# Patient Record
Sex: Male | Born: 1985 | Race: White | Hispanic: No | Marital: Single | Smoking: Never smoker
Health system: Southern US, Community
[De-identification: ages and names within clinical notes are randomized; demographics above are authoritative.]

## PROBLEM LIST (undated history)

## (undated) DIAGNOSIS — F32A Depression, unspecified: Secondary | ICD-10-CM

## (undated) DIAGNOSIS — F419 Anxiety disorder, unspecified: Secondary | ICD-10-CM

---
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
## 2004-08-14 ENCOUNTER — Encounter (INDEPENDENT_AMBULATORY_CARE_PROVIDER_SITE_OTHER): Payer: Self-pay | Admitting: *Deleted

## 2004-08-14 HISTORY — PX: LAPAROSCOPIC APPENDECTOMY: SUR753

## 2004-08-15 ENCOUNTER — Inpatient Hospital Stay (HOSPITAL_COMMUNITY): Admission: EM | Admit: 2004-08-15 | Discharge: 2004-08-17 | Payer: Self-pay | Admitting: Emergency Medicine

## 2004-08-15 ENCOUNTER — Ambulatory Visit: Payer: Self-pay | Admitting: Internal Medicine

## 2007-10-20 ENCOUNTER — Emergency Department (HOSPITAL_COMMUNITY): Admission: EM | Admit: 2007-10-20 | Discharge: 2007-10-20 | Payer: Self-pay | Admitting: Family Medicine

## 2008-05-31 ENCOUNTER — Emergency Department (HOSPITAL_COMMUNITY): Admission: EM | Admit: 2008-05-31 | Discharge: 2008-05-31 | Payer: Self-pay | Admitting: Emergency Medicine
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
## 2010-06-23 LAB — POCT I-STAT, CHEM 8
BUN: 11 mg/dL (ref 6–23)
Chloride: 104 mEq/L (ref 96–112)
HCT: 45 % (ref 39.0–52.0)
Hemoglobin: 15.3 g/dL (ref 13.0–17.0)
Sodium: 141 mEq/L (ref 135–145)

## 2010-06-23 LAB — CBC
HCT: 43.9 % (ref 39.0–52.0)
Hemoglobin: 15 g/dL (ref 13.0–17.0)
MCHC: 34.2 g/dL (ref 30.0–36.0)
MCV: 87.9 fL (ref 78.0–100.0)
Platelets: 160 10*3/uL (ref 150–400)
RBC: 5 MIL/uL (ref 4.22–5.81)
RDW: 12.8 % (ref 11.5–15.5)
WBC: 6.8 10*3/uL (ref 4.0–10.5)

## 2010-06-23 LAB — PROTIME-INR
INR: 1 (ref 0.00–1.49)
Prothrombin Time: 13.2 seconds (ref 11.6–15.2)

## 2010-06-23 LAB — ETHANOL: Alcohol, Ethyl (B): 57 mg/dL — ABNORMAL HIGH (ref 0–10)

## 2010-07-29 NOTE — Op Note (Signed)
NAME:  Keith Rubio, Keith Rubio           ACCOUNT NO.:  [id number]   MEDICAL RECORD NO.:  [id number]          PATIENT TYPE:  EMS   LOCATION:  ED                           FACILITY:  [facility]   PHYSICIAN:  Angelia Mould. Derrell Lolling, M.D.DATE OF BIRTH:  01/05/1986   DATE OF PROCEDURE:  08/14/2004  DATE OF DISCHARGE:                                 OPERATIVE REPORT   PREOPERATIVE DIAGNOSIS:  Acute appendicitis.   POSTOPERATIVE DIAGNOSES:  Acute appendicitis.   OPERATION PERFORMED AT:  Laparoscopic appendectomy.   SURGEON:  Angelia Mould. Derrell Lolling, M.D.   OPERATIVE INDICATIONS:  This is a 25 year old white male who presents with  about a 28-hour history of right lower quadrant pain, this had been steady,  somewhat progressive, moderately severe associated with nausea and vomiting.  No prior similar episodes. On exam, he is found to have a very localized but  significant tenderness and guarding in the right lower quadrant. Lab work  revealed a white blood cell count 22,500. He had no other symptoms, it was  felt most likely he had appendicitis. He was brought to operating room for  diagnostic laparoscopy and laparoscopic appendectomy.   FINDINGS:  The appendix was acutely inflamed and there was some exudate on  it. There was no evidence of rupture or gangrene. The terminal ileum, cecum,  liver, gallbladder and rectosigmoid colon looked fine. No evidence of any  other inflammatory process.   OPERATIVE TECHNIQUE:  Following the induction of general endotracheal  anesthesia, the patient's head was prepped and draped in sterile fashion.  The patient was identified. Intravenous antibiotics were given. 0.5%  Marcaine with epinephrine was used as a local infiltration anesthetic.   A 12 mm OptiView port was placed in the left upper quadrant. This insertion  was atraumatic and uneventful. Pneumoperitoneum was created. The video was  inserted. Visualization was carried out with findings as described above.  There  was no evidence of any bleeding or injury from the to the trocar  insertion. A 5 mm trocar was placed in the left rectus sheath below the  umbilicus and a 12 mm trocars placed in the suprapubic area on the left. We  rolled the cecum and terminal ileum and appendix medially. We had to bluntly  dissect the appendix away from the lateral abdominal wall and from the right  colon because of inflammatory adhesions. We were ultimately able to mobilize  the appendix up a bit and using the harmonic scalpel, divided the  appendiceal artery and the appendiceal mesentery. We did this stepwise until  we had the appendix completely skeletonized away from its mesentery and  could see clearly the insertion of the appendix onto the cecum. The base of  the appendix was transected and secured with an EndoGIA stapling device.  This was placed perpendicularly across the appendix at the base of the  cecum, closed, held in place for about 45 seconds then fired and removed.  The appendix was placed in a specimen bag and removed. The operative field  was inspected and irrigated with saline. We also irrigated out the  subphrenic space of the pelvis a little bit  because there was a little bit  of cloudy fluid. The staple line looked perfectly hemostatic and very  secure. A few staples were free in the abdominal cavity and these were all  retrieved and removed. The trocars were removed under direct vision and  there is no bleeding from the trocar sites. The pneumoperitoneum was  released. The fascia at the trocar sites were closed with interrupted  sutures of #0 Vicryl and this closed the fascia  quite nicely. The wounds were was irrigated with saline and skin closed with  subcuticular sutures of 4-0 Monocryl and Steri-Strips. Clean bandages were  placed and the patient taken to the recovery room in stable condition.  Estimated blood loss about 10 mL. Complications none. Sponge, needle and  instrument counts were  correct.       HMI/MEDQ  D:  08/15/2004  T:  08/15/2004  Job:  [id number]   cc:   [facility], [address]

## 2010-07-29 NOTE — Consult Note (Signed)
Keith Rubio, BOECKMAN           ACCOUNT NO.:  [id number]   MEDICAL RECORD NO.:  [id number]          PATIENT TYPE:  INP   LOCATION:  1321                         FACILITY:  [facility]   PHYSICIAN:  Bernette Redbird, M.D.   DATE OF BIRTH:  03/01/1986   DATE OF CONSULTATION:  08/16/2004  DATE OF DISCHARGE:                                   CONSULTATION   Dr. Derrell Lolling asked me to see this 25 year old because of transient  hematemesis.   Thayer Ohm presented with acute appendicitis for which he underwent a  laparoscopically-assisted appendectomy about 36 hours ago. In the PACU  following surgery, apparently while being extubated, he vomited bright red  blood and was transiently hypoxemic. The patient does have a history of a  very strong gag  reflex. An NG was placed and there was transient heme-  positive drainage which cleared. Since then he has not had any further  bleeding and the NG tube has been discontinued.  Today, he feels well and is  tolerating a regular diet. There has been no drop in hemoglobin which is  stable at 14.1.   At baseline, the patient does have occasional heartburn perhaps once a week  which he does not treat. No problem with dysphagia, loss of weight or  trouble swallowing.   PAST MEDICAL HISTORY:  No known allergies.   OUTPATIENT MEDICATIONS:  None.   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   MEDICAL ILLNESSES:  No chronic conditions.   FAMILY HISTORY:  Not obtained.   SOCIAL HISTORY:  A graduate of [employer].  Now he is preparing  to go the community college in the future and currently works at a [employer].   REVIEW OF SYSTEMS:  Negative for loss of weight or bowel problems.   PHYSICAL EXAM:  Healthy-appearing gentleman in no evident distress  whatsoever appearing neither anxious nor depressed. He is anicteric and  without evident pallor. The  chest is clear. Heart is normal. There is no  supraclavicular adenopathy. There is some abdominal tenderness consistent  with recent surgery but this is probably incisional in origin; no exquisite  tenderness.   LABS:  Admission hemoglobin was 16.1 and today is 14.4. Some of this  probably pertains to hydrational drop. His hemoglobin has been stable over  the past four hours, although it does reflect about a 2 g drop from baseline  on admission. His BUN has been consistently normal since admission including  the level of 7 today. Liver chemistries were normal on admission except for  total bilirubin of 1.4.   IMPRESSION:  Transient upper gastrointestinal bleed which, based on the  patient's history, I think most likely is related to a Mallory-Weiss tear,  although it is conceivable that erosive esophagitis could present in this  fashion.   PLAN:  This the patient has occasional heartburn and a solitary episode of  hematemesis associated with manipulation of the oropharynx in the setting of  a strong gag reflex. I suspect he probably has a  Mallory-Weiss tear and not  any frank esophagitis.   RECOMMENDATIONS:  1.  The patient and his father prefer expectant management rather than      diagnostic evaluation such as an upper endoscopy or an upper GI series,      which were described to them.  2.  From the GI tract standpoint, I think it is fine for the patient to go      home at any time.  3.  No specific follow-up with me is required but I gave the patient my card      in the event that symptoms develop in the future.  4.  There is no clear need for ongoing PPI therapy in this patient but I      will give him a dose of Protonix today and tomorrow morning prior to      discharge to facilitate any healing of gastritis, esophagitis or Mallory-      Weiss mucosal defects that may be present.      RB/MEDQ  D:  08/16/2004  T:  08/16/2004  Job:  [id number]

## 2010-07-29 NOTE — H&P (Signed)
NAME:  Keith Rubio, Keith Rubio           ACCOUNT NO.:  [id number]   MEDICAL RECORD NO.:  [id number]          PATIENT TYPE:  EMS   LOCATION:  ED                           FACILITY:  [facility]   PHYSICIAN:  Angelia Mould. Derrell Lolling, M.D.DATE OF BIRTH:  02-Aug-1985   DATE OF ADMISSION:  08/14/2004  DATE OF DISCHARGE:                                HISTORY & PHYSICAL   CHIEF COMPLAINT:  Right lower quadrant abdominal pain and vomiting.   HISTORY OF PRESENT ILLNESS:  This is a 25 year old white male in excellent  health.  He noted the onset of centralized abdominal pain at approximately 3  p.m. yesterday.  A couple of hours later, it was much more severe and  localized to the right lower quadrant.  He vomited 3 times last night and  once today.  He has not had any diarrhea.  He has not had any prior  episodes.  His highest temperature noted at home was 99.5.  No trouble  urinating.  No prior GI problems.  He went to [facility] this  afternoon, where he was found to have right lower quadrant tenderness, and a  white blood cell count was elevated at over 20,000.  I was called, and the  patient was sent to [facility] emergency room for my evaluation.   PAST HISTORY:  He does not have any medical problems.   PAST SURGICAL HISTORY:  1.  He had a foot operation as an infant.  2.  He had an operation for a right undescended testicle as an infant.   CURRENT MEDICATIONS:  None.   DRUG ALLERGIES:  None.   SOCIAL HISTORY:  The patient is single.  Lives in [place] with his  parents.  He is in school at [employer] this summer, and in the fall will probably  go back to either [employer] or [employer] in business administration.  He  also works at [employer].   FAMILY HISTORY:  Mother and father living and well.  The patient is an only  child.   REVIEW OF SYSTEMS:  All systems were reviewed.  There were noncontributory,  except as described above.   PHYSICAL EXAMINATION:  GENERAL:  Pleasant, healthy,  thin, muscular, young  white male in mild to moderate distress.  Very cooperative.  VITAL SIGNS:  Temperature 99.0, pulse 96, respirations 22, blood pressure  134/69.  HEENT:  Eyes - sclerae clear.  Extraocular movements intact.  Ears, nose,  mouth, throat, nose, lips, tongue, and oropharynx without gross lesions.  NECK:  Supple, nontender.  No mass, no jugular venous distention.  LUNGS:  Clear to auscultation.  No CVA or chest wall tenderness.  HEART:  Regular rate and rhythm.  No murmur.  Radial, femoral, and posterior  tibial pulses are palpable.  No peripheral edema.  ABDOMEN:  Flat, soft.  Hypoactive bowel sounds.  Not distended.  Liver and  spleen not enlarged.  He has localized tenderness in the right lower  quadrant.  He has localized guarding and direct rebound in the right lower  quadrant.  I do not feel a mass.  The rest of  his abdomen is actually fairly  benign.  GU:  Penis, scrotum, and testes are normal.  Both testes are descended.  There is no testicular mass.  No is no inguinal hernia.  EXTREMITIES:  Moves all 4 extremities well without pain or deformity.  NEUROLOGIC:  No gross motor or sensory deficits.   ADMISSION DATA:  Hemoglobin 16.6, white blood cell count 22,500.  Urinalysis  and comprehensive metabolic panel are pending.   ASSESSMENT:  Acute appendicitis is highly probable.  Other things to  consider in the differential would be Meckel's diverticulitis, Crohn's  disease, or urologic problem, but this seems highly unlikely, given the  history and physical findings.   PLAN:  The patient will be admitted and hydrated with IV fluids.  Intravenous antibiotics will be started.  The rest of his laboratory work  will be checked.  He will be taken to the operating room for diagnostic  laparoscopy and probable laparoscopic appendectomy.   I have discussed the indications and details of surgery with the patient and  his father.  Risks and complications have been  outlined including, but not  limited to, bleeding, infection, conversion to open laparotomy, injury to  adjacent organs such as the intestine, ureter, or vascular structures, with  major reconstructive surgery, wound problems such as infection or hernia,  cardiac, pulmonary, and thromboembolic problems.  He seems to understand  these issues well.  At this time, all of his questions were answered.  He is  in full agreement with this plan.       HMI/MEDQ  D:  08/14/2004  T:  08/14/2004  Job:  [id number]   cc:   [facility]  [address]

## 2014-09-29 ENCOUNTER — Ambulatory Visit
Admission: RE | Admit: 2014-09-29 | Discharge: 2014-09-29 | Disposition: A | Discharge status: Home / Self Care | Payer: Self-pay | Source: Ambulatory Visit | Attending: Family Medicine | Admitting: Family Medicine

## 2014-09-29 ENCOUNTER — Other Ambulatory Visit: Payer: Self-pay | Admitting: Radiology

## 2014-09-29 ENCOUNTER — Other Ambulatory Visit: Payer: Self-pay | Admitting: Family Medicine

## 2014-09-29 DIAGNOSIS — S63601A Unspecified sprain of right thumb, initial encounter: Secondary | ICD-10-CM

## 2015-10-06 ENCOUNTER — Other Ambulatory Visit: Payer: Self-pay | Admitting: Family Medicine

## 2015-10-06 DIAGNOSIS — N50812 Left testicular pain: Secondary | ICD-10-CM

## 2015-10-11 ENCOUNTER — Ambulatory Visit
Admission: RE | Admit: 2015-10-11 | Discharge: 2015-10-11 | Disposition: A | Discharge status: Home / Self Care | Payer: BLUE CROSS/BLUE SHIELD | Source: Ambulatory Visit | Attending: Family Medicine | Admitting: Family Medicine

## 2015-10-11 DIAGNOSIS — N50812 Left testicular pain: Secondary | ICD-10-CM

## 2019-04-21 ENCOUNTER — Ambulatory Visit: Payer: Self-pay | Admitting: Surgery

## 2019-04-21 ENCOUNTER — Encounter: Payer: Self-pay | Admitting: Surgery

## 2019-04-21 DIAGNOSIS — I861 Scrotal varices: Secondary | ICD-10-CM | POA: Insufficient documentation

## 2019-04-21 DIAGNOSIS — K4090 Unilateral inguinal hernia, without obstruction or gangrene, not specified as recurrent: Secondary | ICD-10-CM | POA: Insufficient documentation

## 2019-04-21 NOTE — H&P (Signed)
Molli Posey Documented: 04/21/2019 3:57 PM  DOB: 08-30-1985   History of Present Illness [name] MD; 04/21/2019 4:51 PM) The patient is a 34 year old male who presents with an inguinal hernia. Note for "Inguinal hernia": ` ` ` Patient sent for surgical consultation at the request of Dr Docia Chuck  Chief Complaint: Left groin swelling. Probable hernia. ` ` The patient is a pleasant young active thin male. His had intermittent left groin discomfort and swelling. Saw Dr. Derrell Lolling in 2017. Some concerns for groin pain and possible hernia but not definite. CT scan offered before deciding on surgery. Patient felt better and held off. He notes that the pain persisted. Had more episodes of bulging. More obvious now. He has more constant but small bulge. Had episode of worsening swelling and discomfort last month that concerned him. Wished to be reevaluated. Dr. Derrell Lolling has since retired, separation comes in to see me. 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 An emergent appendectomy by Dr. Derrell Lolling in 2006. No other surgeries. He does not smoke tobacco. He occasionally has smokes marijuana for some anxiety and other issues. Not a diabetic. Walks at least a half hour without difficulty. No cardiac or pulmonary issues. No problems with urination. Moses bowels every day. No history of skin infections.  (Review of systems as stated in this history (HPI) or in the review of systems. Otherwise all other 12 point ROS are negative) ` ` `  This patient encounter took 35 minutes today to perform the following: obtain history, perform exam, review outside records, interpret tests & imaging, counsel the patient on their diagnosis; and, document this encounter, including findings & plan in the electronic health record (EHR).   Past Surgical History [name], MD; 04/21/2019 4:43 PM) Appendectomy [2008]: Foot  Surgery Left. EXPLORATION, TESTICLE, UNDESCENDED, INGUINAL APPROACH [phone]) [1985]:  Diagnostic Studies History Renee Ramus, CMA; 04/21/2019 3:57 PM) Colonoscopy never  Medication History (Armen Emelda Fear, CMA; 04/21/2019 3:58 PM) No Current Medications Medications Reconciled  Social History Renee Ramus, CMA; 04/21/2019 3:57 PM) Alcohol use Occasional alcohol use. Caffeine use Carbonated beverages, Coffee. Illicit drug use Prefer to discuss with provider. Tobacco use Never smoker.  Family History Renee Ramus, CMA; 04/21/2019 3:57 PM) First Degree Relatives No pertinent family history  Other Problems Renee Ramus, CMA; 04/21/2019 3:57 PM) Anxiety Disorder Back Pain Depression Hemorrhoids Inguinal Hernia     Review of Systems (Armen Ferguson CMA; 04/21/2019 3:57 PM) General Present- Night Sweats. Not Present- Appetite Loss, Chills, Fatigue, Fever, Weight Gain and Weight Loss. Skin Present- Dryness. Not Present- Change in Wart/Mole, Hives, Jaundice, New Lesions, Non-Healing Wounds, Rash and Ulcer. HEENT Present- Seasonal Allergies. Not Present- Earache, Hearing Loss, Hoarseness, Nose Bleed, Oral Ulcers, Ringing in the Ears, Sinus Pain, Sore Throat, Visual Disturbances, Wears glasses/contact lenses and Yellow Eyes. Respiratory Not Present- Bloody sputum, Chronic Cough, Difficulty Breathing, Snoring and Wheezing. Breast Not Present- Breast Mass, Breast Pain, Nipple Discharge and Skin Changes. Cardiovascular Not Present- Chest Pain, Difficulty Breathing Lying Down, Leg Cramps, Palpitations, Rapid Heart Rate, Shortness of Breath and Swelling of Extremities. Gastrointestinal Present- Hemorrhoids and Indigestion. Not Present- Abdominal Pain, Bloating, Bloody Stool, Change in Bowel Habits, Chronic diarrhea, Constipation, Difficulty Swallowing, Excessive gas, Gets full quickly at meals, Nausea, Rectal Pain and Vomiting. Male Genitourinary Not Present- Blood in Urine,  Change in Urinary Stream, Frequency, Impotence, Nocturia, Painful Urination, Urgency and Urine Leakage. Musculoskeletal Present- Back Pain and Joint  Stiffness. Not Present- Joint Pain, Muscle Pain, Muscle Weakness and Swelling of Extremities. Neurological Present- Headaches and Tingling. Not Present- Decreased Memory, Fainting, Numbness, Seizures, Tremor, Trouble walking and Weakness. Psychiatric Present- Anxiety and Depression. Not Present- Bipolar, Change in Sleep Pattern, Fearful and Frequent crying. Endocrine Not Present- Cold Intolerance, Excessive Hunger, Hair Changes, Heat Intolerance, Hot flashes and New Diabetes. Hematology Not Present- Blood Thinners, Easy Bruising, Excessive bleeding, Gland problems, HIV and Persistent Infections.  Vitals (Armen Ferguson CMA; 04/21/2019 3:58 PM) 04/21/2019 3:57 PM Weight: 163.38 lb Height: 74in Body Surface Area: 1.99 m Body Mass Index: 20.98 kg/m  Temp.: 98.17F  Pulse: 78 (Regular)  P.OX: 98% (Room air) BP: 102/72 (Sitting, Left Arm, Standard)        Physical Exam [name] MD; 04/21/2019 4:48 PM)  General Mental Status-Alert. General Appearance-Not in acute distress, Not Sickly. Orientation-Oriented X3. Hydration-Well hydrated. Voice-Normal.  Integumentary Global Assessment Upon inspection and palpation of skin surfaces of the - Axillae: non-tender, no inflammation or ulceration, no drainage. and Distribution of scalp and body hair is normal. General Characteristics Temperature - normal warmth is noted.  Head and Neck Head-normocephalic, atraumatic with no lesions or palpable masses. Face Global Assessment - atraumatic, no absence of expression. Neck Global Assessment - no abnormal movements, no bruit auscultated on the right, no bruit auscultated on the left, no decreased range of motion, non-tender. Trachea-midline. Thyroid Gland Characteristics - non-tender.  Eye Eyeball - Left-Extraocular  movements intact, No Nystagmus - Left. Eyeball - Right-Extraocular movements intact, No Nystagmus - Right. Cornea - Left-No Hazy - Left. Cornea - Right-No Hazy - Right. Sclera/Conjunctiva - Left-No scleral icterus, No Discharge - Left. Sclera/Conjunctiva - Right-No scleral icterus, No Discharge - Right. Pupil - Left-Direct reaction to light normal. Pupil - Right-Direct reaction to light normal.  ENMT Ears Pinna - Left - no drainage observed, no generalized tenderness observed. Pinna - Right - no drainage observed, no generalized tenderness observed. Nose and Sinuses External Inspection of the Nose - no destructive lesion observed. Inspection of the nares - Left - quiet respiration. Inspection of the nares - Right - quiet respiration. Mouth and Throat Lips - Upper Lip - no fissures observed, no pallor noted. Lower Lip - no fissures observed, no pallor noted. Nasopharynx - no discharge present. Oral Cavity/Oropharynx - Tongue - no dryness observed. Oral Mucosa - no cyanosis observed. Hypopharynx - no evidence of airway distress observed.  Chest and Lung Exam Inspection Movements - Normal and Symmetrical. Accessory muscles - No use of accessory muscles in breathing. Palpation Palpation of the chest reveals - Non-tender. Auscultation Breath sounds - Normal and Clear.  Cardiovascular Auscultation Rhythm - Regular. Murmurs & Other Heart Sounds - Auscultation of the heart reveals - No Murmurs and No Systolic Clicks.  Abdomen Inspection Inspection of the abdomen reveals - No Visible peristalsis and No Abnormal pulsations. Umbilicus - No Bleeding, No Urine drainage. Palpation/Percussion Palpation and Percussion of the abdomen reveal - Soft, Non Tender, No Rebound tenderness, No Rigidity (guarding) and No Cutaneous hyperesthesia. Note: Abdomen soft. Nontender. Not distended. No umbilical or incisional hernias. No guarding.  Male Genitourinary Sexual Maturity Tanner 5 -  Adult hair pattern and Adult penile size and shape. Note: Left groin bulging consistent with reducible inguinal hernia. Moderate varicocele.  On the right side some sensitivity on/Valsalva. Possible early right inguinal hernia. No varicocele on that side.  Peripheral Vascular Upper Extremity Inspection - Left - No Cyanotic nailbeds - Left, Not Ischemic. Inspection - Right -  No Cyanotic nailbeds - Right, Not Ischemic.  Neurologic Neurologic evaluation reveals -normal attention span and ability to concentrate, able to name objects and repeat phrases. Appropriate fund of knowledge , normal sensation and normal coordination. Mental Status Affect - not angry, not paranoid. Cranial Nerves-Normal Bilaterally. Gait-Normal.  Neuropsychiatric Mental status exam performed with findings of-able to articulate well with normal speech/language, rate, volume and coherence, thought content normal with ability to perform basic computations and apply abstract reasoning and no evidence of hallucinations, delusions, obsessions or homicidal/suicidal ideation.  Musculoskeletal Global Assessment Spine, Ribs and Pelvis - no instability, subluxation or laxity. Right Upper Extremity - no instability, subluxation or laxity.  Lymphatic Head & Neck  General Head & Neck Lymphatics: Bilateral - Description - No Localized lymphadenopathy. Axillary  General Axillary Region: Bilateral - Description - No Localized lymphadenopathy. Femoral & Inguinal  Generalized Femoral & Inguinal Lymphatics: Left - Description - No Localized lymphadenopathy. Right - Description - No Localized lymphadenopathy.    Assessment & Plan [name] MD; 04/21/2019 4:42 PM)  LEFT INGUINAL HERNIA (K40.90) Impression: Persistent intermittent groin pain now with swelling and obvious left hernia. Some sensitivity on the right as well  I think he would benefit from hernia repair. I would do laparoscopic approach to make sure  there is no hernia on that side other. Marland Kitchen  He did have concerns about mesh, reading stories online. I tried to reassure him. Recalls having some agitation in the recovery room after his emergent appendectomy in 2008. Hopefully they can minimize postoperative issues. Good candidate for ERAS pathway   VARICOCELE (I86.1) Impression: Moderate but relatively stable varicocele compared to ultrasound 2017.   PREOP - ING HERNIA - ENCOUNTER FOR PREOPERATIVE EXAMINATION FOR GENERAL SURGICAL PROCEDURE (Z01.818)  Current Plans You are being scheduled for surgery- Our schedulers will call you.  You should hear from our office's scheduling department within 5 working days about the location, date, and time of surgery. We try to make accommodations for patient's preferences in scheduling surgery, but sometimes the OR schedule or the surgeon's schedule prevents [place] from making those accommodations.  If you have not heard from our office [phone]) in 5 working days, call the office and ask for your surgeon's nurse.  If you have other questions about your diagnosis, plan, or surgery, call the office and ask for your surgeon's nurse.  Written instructions provided The anatomy & physiology of the abdominal wall and pelvic floor was discussed. The pathophysiology of hernias in the inguinal and pelvic region was discussed. Natural history risks such as progressive enlargement, pain, incarceration, and strangulation was discussed. Contributors to complications such as smoking, obesity, diabetes, prior surgery, etc were discussed.  I feel the risks of no intervention will lead to serious problems that outweigh the operative risks; therefore, I recommended surgery to reduce and repair the hernia. I explained laparoscopic techniques with possible need for an open approach. I noted usual use of mesh to patch and/or buttress hernia repair  Risks such as bleeding, infection, abscess, need for further  treatment, heart attack, death, and other risks were discussed. I noted a good likelihood this will help address the problem. Goals of post-operative recovery were discussed as well. Possibility that this will not correct all symptoms was explained. I stressed the importance of low-impact activity, aggressive pain control, avoiding constipation, & not pushing through pain to minimize risk of post-operative chronic pain or injury. Possibility of reherniation was discussed. We will work to minimize complications.  An educational  handout further explaining the pathology & treatment options was given as well. Questions were answered. The patient expresses understanding & wishes to proceed with surgery.  Pt Education - Pamphlet Given - Laparoscopic Hernia Repair: discussed with patient and provided information. Pt Education - [facility] Pain Control (Dajahnae Vondra) Pt Education - [facility] Hernia Post-Op HCI (Tawni Melkonian): discussed with patient and provided information. Pt Education - [facility] Mesh education: discussed with patient and provided information.  Adin Hector, MD, FACS, MASCRS Gastrointestinal and Minimally Invasive Surgery  [facility] [address] [place], [place] [address] [phone] Main / Paging [phone] Fax

## 2022-05-01 ENCOUNTER — Ambulatory Visit: Payer: Self-pay | Admitting: Surgery

## 2022-07-06 ENCOUNTER — Encounter (HOSPITAL_BASED_OUTPATIENT_CLINIC_OR_DEPARTMENT_OTHER): Payer: Self-pay | Admitting: Surgery

## 2022-07-06 NOTE — Progress Notes (Signed)
Spoke w/ via phone for pre-op interview---Reg Lab needs dos----NONE               Lab results------ COVID test -----patient states asymptomatic no test needed Arrive at -------0830 NPO after MN NO Solid Food.  Clear liquids from MN until---0730 Med rec completed Medications to take morning of surgery -----Celexa Diabetic medication ----- Patient instructed no nail polish to be worn day of surgery Patient instructed to bring photo id and insurance card day of surgery Patient aware to have Driver (ride ) / caregiver Girlfriend Linward Natal   for 24 hours after surgery  Patient Special Instructions ----- Pre-Op special Instructions ----- Patient verbalized understanding of instructions that were given at this phone interview. Patient denies shortness of breath, chest pain, fever, cough at this phone interview.

## 2022-07-21 ENCOUNTER — Ambulatory Visit (HOSPITAL_BASED_OUTPATIENT_CLINIC_OR_DEPARTMENT_OTHER): Payer: 59 | Admitting: Anesthesiology

## 2022-07-21 ENCOUNTER — Encounter (HOSPITAL_BASED_OUTPATIENT_CLINIC_OR_DEPARTMENT_OTHER)
Admission: RE | Disposition: A | Discharge status: Home / Self Care | Payer: Self-pay | Source: Home / Self Care | Attending: Surgery

## 2022-07-21 ENCOUNTER — Other Ambulatory Visit: Payer: Self-pay

## 2022-07-21 ENCOUNTER — Ambulatory Visit (HOSPITAL_BASED_OUTPATIENT_CLINIC_OR_DEPARTMENT_OTHER)
Admission: RE | Admit: 2022-07-21 | Discharge: 2022-07-21 | Disposition: A | Discharge status: Home / Self Care | Payer: 59 | Attending: Surgery | Admitting: Surgery

## 2022-07-21 ENCOUNTER — Encounter (HOSPITAL_BASED_OUTPATIENT_CLINIC_OR_DEPARTMENT_OTHER): Payer: Self-pay | Admitting: Surgery

## 2022-07-21 DIAGNOSIS — N433 Hydrocele, unspecified: Secondary | ICD-10-CM | POA: Diagnosis not present

## 2022-07-21 DIAGNOSIS — I861 Scrotal varices: Secondary | ICD-10-CM | POA: Insufficient documentation

## 2022-07-21 DIAGNOSIS — F418 Other specified anxiety disorders: Secondary | ICD-10-CM | POA: Diagnosis not present

## 2022-07-21 DIAGNOSIS — K4020 Bilateral inguinal hernia, without obstruction or gangrene, not specified as recurrent: Secondary | ICD-10-CM

## 2022-07-21 HISTORY — DX: Anxiety disorder, unspecified: F41.9

## 2022-07-21 HISTORY — DX: Depression, unspecified: F32.A

## 2022-07-21 HISTORY — PX: HYDROCELE EXCISION: SHX482

## 2022-07-21 HISTORY — PX: INGUINAL HERNIA REPAIR: SHX194

## 2022-07-21 SURGERY — REPAIR, HERNIA, INGUINAL, LAPAROSCOPIC
Anesthesia: General | Laterality: Left

## 2022-07-21 MED ORDER — MIDAZOLAM HCL 2 MG/2ML IJ SOLN
INTRAMUSCULAR | Status: DC | PRN
  Administered 2022-07-21: 2 mg via INTRAVENOUS

## 2022-07-21 MED ORDER — GLYCOPYRROLATE PF 0.2 MG/ML IJ SOSY
PREFILLED_SYRINGE | INTRAMUSCULAR | Status: AC
  Filled 2022-07-21: qty 1

## 2022-07-21 MED ORDER — ONDANSETRON HCL 4 MG/2ML IJ SOLN
INTRAMUSCULAR | Status: DC | PRN
  Administered 2022-07-21: 4 mg via INTRAVENOUS

## 2022-07-21 MED ORDER — FENTANYL CITRATE (PF) 100 MCG/2ML IJ SOLN
INTRAMUSCULAR | Status: AC
  Filled 2022-07-21: qty 2

## 2022-07-21 MED ORDER — CELECOXIB 200 MG PO CAPS
ORAL_CAPSULE | ORAL | Status: AC
  Filled 2022-07-21: qty 1

## 2022-07-21 MED ORDER — LIDOCAINE 2% (20 MG/ML) 5 ML SYRINGE
INTRAMUSCULAR | Status: DC | PRN
  Administered 2022-07-21: 60 mg via INTRAVENOUS

## 2022-07-21 MED ORDER — LACTATED RINGERS IV SOLN: INTRAVENOUS | Status: DC

## 2022-07-21 MED ORDER — FENTANYL CITRATE (PF) 100 MCG/2ML IJ SOLN
25.0000 ug | INTRAMUSCULAR | Status: DC | PRN
  Administered 2022-07-21: 25 ug via INTRAVENOUS

## 2022-07-21 MED ORDER — ROCURONIUM BROMIDE 10 MG/ML (PF) SYRINGE
PREFILLED_SYRINGE | INTRAVENOUS | Status: DC | PRN
  Administered 2022-07-21: 10 mg via INTRAVENOUS
  Administered 2022-07-21: 50 mg via INTRAVENOUS

## 2022-07-21 MED ORDER — GABAPENTIN 300 MG PO CAPS
300.0000 mg | ORAL_CAPSULE | ORAL | Status: AC
  Administered 2022-07-21: 300 mg via ORAL

## 2022-07-21 MED ORDER — CEFAZOLIN SODIUM-DEXTROSE 2-4 GM/100ML-% IV SOLN
2.0000 g | INTRAVENOUS | Status: AC
  Administered 2022-07-21: 2 g via INTRAVENOUS

## 2022-07-21 MED ORDER — CHLORHEXIDINE GLUCONATE CLOTH 2 % EX PADS: 6.0000 | MEDICATED_PAD | Freq: Once | CUTANEOUS | Status: DC

## 2022-07-21 MED ORDER — PROPOFOL 10 MG/ML IV BOLUS
INTRAVENOUS | Status: DC | PRN
  Administered 2022-07-21: 150 mg via INTRAVENOUS
  Administered 2022-07-21: 50 mg via INTRAVENOUS

## 2022-07-21 MED ORDER — LIDOCAINE HCL (PF) 2 % IJ SOLN
INTRAMUSCULAR | Status: AC
  Filled 2022-07-21: qty 5

## 2022-07-21 MED ORDER — BUPIVACAINE LIPOSOME 1.3 % IJ SUSP
INTRAMUSCULAR | Status: DC | PRN
  Administered 2022-07-21: 20 mL

## 2022-07-21 MED ORDER — CELECOXIB 200 MG PO CAPS
200.0000 mg | ORAL_CAPSULE | ORAL | Status: AC
  Administered 2022-07-21: 200 mg via ORAL

## 2022-07-21 MED ORDER — DEXAMETHASONE SODIUM PHOSPHATE 10 MG/ML IJ SOLN
INTRAMUSCULAR | Status: DC | PRN
  Administered 2022-07-21 (×2): 5 mg via INTRAVENOUS

## 2022-07-21 MED ORDER — GABAPENTIN 300 MG PO CAPS
ORAL_CAPSULE | ORAL | Status: AC
  Filled 2022-07-21: qty 1

## 2022-07-21 MED ORDER — ACETAMINOPHEN 500 MG PO TABS
ORAL_TABLET | ORAL | Status: AC
  Filled 2022-07-21: qty 2

## 2022-07-21 MED ORDER — MIDAZOLAM HCL 2 MG/2ML IJ SOLN
INTRAMUSCULAR | Status: AC
  Filled 2022-07-21: qty 2

## 2022-07-21 MED ORDER — EPHEDRINE SULFATE (PRESSORS) 50 MG/ML IJ SOLN
INTRAMUSCULAR | Status: DC | PRN
  Administered 2022-07-21: 10 mg via INTRAVENOUS

## 2022-07-21 MED ORDER — EPHEDRINE 5 MG/ML INJ
INTRAVENOUS | Status: AC
  Filled 2022-07-21: qty 5

## 2022-07-21 MED ORDER — BUPIVACAINE-EPINEPHRINE 0.25% -1:200000 IJ SOLN
INTRAMUSCULAR | Status: DC | PRN
  Administered 2022-07-21: 50 mL

## 2022-07-21 MED ORDER — PROPOFOL 10 MG/ML IV BOLUS
INTRAVENOUS | Status: AC
  Filled 2022-07-21: qty 20

## 2022-07-21 MED ORDER — ACETAMINOPHEN 500 MG PO TABS: 1000.0000 mg | ORAL_TABLET | ORAL | Status: DC

## 2022-07-21 MED ORDER — ACETAMINOPHEN 500 MG PO TABS
1000.0000 mg | ORAL_TABLET | Freq: Once | ORAL | Status: AC
  Administered 2022-07-21: 1000 mg via ORAL

## 2022-07-21 MED ORDER — ROCURONIUM BROMIDE 10 MG/ML (PF) SYRINGE
PREFILLED_SYRINGE | INTRAVENOUS | Status: AC
  Filled 2022-07-21: qty 10

## 2022-07-21 MED ORDER — FENTANYL CITRATE (PF) 100 MCG/2ML IJ SOLN
INTRAMUSCULAR | Status: DC | PRN
  Administered 2022-07-21 (×4): 50 ug via INTRAVENOUS

## 2022-07-21 MED ORDER — DEXAMETHASONE SODIUM PHOSPHATE 10 MG/ML IJ SOLN: 4.0000 mg | INTRAMUSCULAR | Status: DC

## 2022-07-21 MED ORDER — SUGAMMADEX SODIUM 200 MG/2ML IV SOLN
INTRAVENOUS | Status: DC | PRN
  Administered 2022-07-21: 200 mg via INTRAVENOUS

## 2022-07-21 MED ORDER — BUPIVACAINE LIPOSOME 1.3 % IJ SUSP: 20.0000 mL | Freq: Once | INTRAMUSCULAR | Status: DC

## 2022-07-21 MED ORDER — GLYCOPYRROLATE 0.2 MG/ML IJ SOLN
INTRAMUSCULAR | Status: DC | PRN
  Administered 2022-07-21: .2 mg via INTRAVENOUS

## 2022-07-21 MED ORDER — TRAMADOL HCL 50 MG PO TABS: 50.0000 mg | ORAL_TABLET | Freq: Four times a day (QID) | ORAL | 0 refills | Status: AC | PRN

## 2022-07-21 MED ORDER — ENSURE PRE-SURGERY PO LIQD: 296.0000 mL | Freq: Once | ORAL | Status: DC

## 2022-07-21 MED ORDER — CEFAZOLIN SODIUM-DEXTROSE 2-4 GM/100ML-% IV SOLN
INTRAVENOUS | Status: AC
  Filled 2022-07-21: qty 100

## 2022-07-21 SURGICAL SUPPLY — 35 items
APL PRP STRL LF DISP 70% ISPRP (MISCELLANEOUS) ×2
CABLE HIGH FREQUENCY MONO STRZ (ELECTRODE) ×2 IMPLANT
CHLORAPREP W/TINT 26 (MISCELLANEOUS) ×2 IMPLANT
DRAPE WARM FLUID 44X44 (DRAPES) ×2 IMPLANT
DRSG TEGADERM 2-3/8X2-3/4 SM (GAUZE/BANDAGES/DRESSINGS) ×4 IMPLANT
DRSG TEGADERM 4X4.75 (GAUZE/BANDAGES/DRESSINGS) ×2 IMPLANT
ELECT REM PT RETURN 9FT ADLT (ELECTROSURGICAL) ×2
ELECTRODE REM PT RTRN 9FT ADLT (ELECTROSURGICAL) ×2 IMPLANT
GAUZE 4X4 16PLY ~~LOC~~+RFID DBL (SPONGE) ×2 IMPLANT
GLOVE BIOGEL PI IND STRL 8 (GLOVE) ×2 IMPLANT
GLOVE ECLIPSE 8.0 STRL XLNG CF (GLOVE) ×2 IMPLANT
GOWN STRL REUS W/TWL XL LVL3 (GOWN DISPOSABLE) ×2 IMPLANT
KIT PINK PAD W/HEAD ARE REST (MISCELLANEOUS) ×2
KIT PINK PAD W/HEAD ARM REST (MISCELLANEOUS) ×4 IMPLANT
KIT TURNOVER CYSTO (KITS) ×2 IMPLANT
MESH BARD SOFT 6X6IN (Mesh General) IMPLANT
MESH HERNIA 6X6 BARD (Mesh General) IMPLANT
NDL HYPO 22X1.5 SAFETY MO (MISCELLANEOUS) ×2 IMPLANT
NEEDLE HYPO 22X1.5 SAFETY MO (MISCELLANEOUS) ×2 IMPLANT
PACK BASIN DAY SURGERY FS (CUSTOM PROCEDURE TRAY) ×2 IMPLANT
SCISSORS LAP 5X35 DISP (ENDOMECHANICALS) ×2 IMPLANT
SET TUBE SMOKE EVAC HIGH FLOW (TUBING) ×2 IMPLANT
SLEEVE ADV FIXATION 5X100MM (TROCAR) ×2 IMPLANT
SLEEVE SCD COMPRESS KNEE MED (STOCKING) ×2 IMPLANT
SPONGE GAUZE 2X2 8PLY STRL LF (GAUZE/BANDAGES/DRESSINGS) ×6 IMPLANT
SUT MNCRL AB 4-0 PS2 18 (SUTURE) ×2 IMPLANT
SUT VIC AB 2-0 SH 27 (SUTURE) ×2
SUT VIC AB 2-0 SH 27XBRD (SUTURE) IMPLANT
SUT VICRYL 0 UR6 27IN ABS (SUTURE) IMPLANT
TOWEL OR 17X24 6PK STRL BLUE (TOWEL DISPOSABLE) ×2 IMPLANT
TRAY DSU PREP LF (CUSTOM PROCEDURE TRAY) ×2 IMPLANT
TRAY LAPAROSCOPIC (CUSTOM PROCEDURE TRAY) ×2 IMPLANT
TROCAR ADV FIXATION 5X100MM (TROCAR) ×2 IMPLANT
TROCAR BALLN 12MMX100 BLUNT (TROCAR) ×2 IMPLANT
WATER STERILE IRR 500ML POUR (IV SOLUTION) ×2 IMPLANT

## 2022-07-21 NOTE — Anesthesia Procedure Notes (Signed)
Procedure Name: Intubation Date/Time: 07/21/2022 11:04 AM  Performed by: Francie Massing, CRNAPre-anesthesia Checklist: Patient identified, Emergency Drugs available, Suction available and Patient being monitored Patient Re-evaluated:Patient Re-evaluated prior to induction Oxygen Delivery Method: Circle system utilized Preoxygenation: Pre-oxygenation with 100% oxygen Induction Type: IV induction Ventilation: Mask ventilation without difficulty Laryngoscope Size: Mac and 4 Grade View: Grade I Tube type: Oral Tube size: 7.0 mm Number of attempts: 1 Airway Equipment and Method: Stylet and Oral airway Placement Confirmation: ETT inserted through vocal cords under direct vision, positive ETCO2 and breath sounds checked- equal and bilateral Secured at: 22 cm Tube secured with: Tape Dental Injury: Teeth and Oropharynx as per pre-operative assessment

## 2022-07-21 NOTE — Anesthesia Postprocedure Evaluation (Signed)
Anesthesia Post Note  Patient: Keith Rubio  Procedure(s) Performed: LAPAROSCOPIC LEFT AND POSSIBLE RIGHT INGUINAL HERNIA (Left) TRANSABDOMINAL LEFT HYDROCELECTOMY ADULT     Patient location during evaluation: PACU Anesthesia Type: General Level of consciousness: awake and alert Pain management: pain level controlled Vital Signs Assessment: post-procedure vital signs reviewed and stable Respiratory status: spontaneous breathing, nonlabored ventilation, respiratory function stable and patient connected to nasal cannula oxygen Cardiovascular status: blood pressure returned to baseline and stable Postop Assessment: no apparent nausea or vomiting Anesthetic complications: no  No notable events documented.  Last Vitals:  Vitals:   07/21/22 1330 07/21/22 1345  BP: 121/66 126/69  Pulse: 83 64  Resp: 17 14  Temp:    SpO2: 100% 96%    Last Pain:  Vitals:   07/21/22 1339  TempSrc:   PainSc: 5                  Aylene Acoff L Layli Capshaw

## 2022-07-21 NOTE — Discharge Instructions (Addendum)
HERNIA REPAIR: POST OP INSTRUCTIONS  ######################################################################  EAT Gradually transition to a high fiber diet with a fiber supplement over the next few weeks after discharge.  Start with a pureed / full liquid diet (see below)  WALK Walk an hour a day.  Control your pain to do that.    CONTROL PAIN Control pain so that you can walk, sleep, tolerate sneezing/coughing, and go up/down stairs.  HAVE A BOWEL MOVEMENT DAILY Keep your bowels regular to avoid problems.  OK to try a laxative to override constipation.  OK to use an antidairrheal to slow down diarrhea.  Call if not better after 2 tries  CALL IF YOU HAVE PROBLEMS/CONCERNS Call if you are still struggling despite following these instructions. Call if you have concerns not answered by these instructions  ######################################################################    DIET: Follow a light bland diet & liquids the first 24 hours after arrival home, such as soup, liquids, starches, etc.  Be sure to drink plenty of fluids.  Quickly advance to a usual solid diet within a few days.  Avoid fast food or heavy meals as your are more likely to get nauseated or have irregular bowels.  A low-fat, high-fiber diet for the rest of your life is ideal.   Take your usually prescribed home medications unless otherwise directed.  PAIN CONTROL: Pain is best controlled by a usual combination of three different methods TOGETHER: Ice/Heat Over the counter pain medication Prescription pain medication Most patients will experience some swelling and bruising around the hernia(s) such as the bellybutton, groins, or old incisions.  Ice packs or heating pads (30-60 minutes up to 6 times a day) will help. Use ice for the first few days to help decrease swelling and bruising, then switch to heat to help relax tight/sore spots and speed recovery.  Some people prefer to use ice alone, heat alone, alternating  between ice & heat.  Experiment to what works for you.  Swelling and bruising can take several weeks to resolve.   It is helpful to take an over-the-counter pain medication regularly for the first few weeks.  Choose one of the following that works best for you: Naproxen (Aleve, etc)  Two 220mg  tabs twice a day Ibuprofen (Advil, etc) Three 200mg  tabs four times a day (every meal & bedtime) Acetaminophen (Tylenol, etc) 325-650mg  four times a day (every meal & bedtime) A  prescription for pain medication should be given to you upon discharge.  Take your pain medication as prescribed.  If you are having problems/concerns with the prescription medicine (does not control pain, nausea, vomiting, rash, itching, etc), please call us [phone] to see if we need to switch you to a different pain medicine that will work better for you and/or control your side effect better. If you need a refill on your pain medication, please contact your pharmacy.  They will contact our office to request authorization. Prescriptions will not be filled after 5 pm or on week-ends.  AVOID GETTING CONSTIPATED.   Between the surgery and the pain medications, it is common to experience some constipation.  Drink plenty of liquids Take a fiber supplement 2 times day (such as Metamucil, Citrucel, FiberCon, MiraLax, etc) to have a bowel movement every day. If you have not had a BM by 2 days after surgery: -drink liquids only until you have a bowel movement -take MiraLAX 2 doses every 2 hours until you have a bowel movement   Wash / shower every day.  You may  shower over the dressings as they are waterproof.    It is good for closed incisions and even open wounds to be washed every day.  Shower every day.  Short baths are fine.  Wash the incisions and wounds clean with soap & water.    You may leave closed incisions open to air if it is dry.   You may cover the incision with clean gauze & replace it after your daily shower for  comfort.  TEGADERM:  You have clear gauze band-aid dressings over your closed incision(s).  Remove the dressings 3 days after surgery. = Monday    ACTIVITIES as tolerated:   You may resume regular (light) daily activities beginning the next day--such as daily self-care, walking, climbing stairs--gradually increasing activities as tolerated.  Control your pain so that you can walk an hour a day.  If you can walk 30 minutes without difficulty, it is safe to try more intense activity such as jogging, treadmill, bicycling, low-impact aerobics, swimming, etc. Save the most intensive and strenuous activity for last such as sit-ups, heavy lifting, contact sports, etc  Refrain from any heavy lifting or straining until you are off narcotics for pain control.   DO NOT PUSH THROUGH PAIN.  Let pain be your guide: If it hurts to do something, don't do it.  Pain is your body warning you to avoid that activity for another week until the pain goes down. You may drive when you are no longer taking prescription pain medication, you can comfortably wear a seatbelt, and you can safely maneuver your car and apply brakes. You may have sexual intercourse when it is comfortable.   FOLLOW UP in our office Please call [facility] at [phone] to set up an appointment to see your surgeon in the office for a follow-up appointment approximately 2-3 weeks after your surgery. Make sure that you call for this appointment the day you arrive home to insure a convenient appointment time.  9.  If you have disability of FMLA / Family leave forms, please bring the forms to the office for processing.  (do not give to your surgeon).  WHEN TO CALL us [phone]: Poor pain control Reactions / problems with new medications (rash/itching, nausea, etc)  Fever over 101.5 F (38.5 C) Inability to urinate Nausea and/or vomiting Worsening swelling or bruising Continued bleeding from incision. Increased pain, redness, or drainage from the  incision   The clinic staff is available to answer your questions during regular business hours (8:30am-5pm).  Please don't hesitate to call and ask to speak to one of our nurses for clinical concerns.   If you have a medical emergency, go to the nearest emergency room or call 911.  A surgeon from [facility] is always on call at the hospitals in [facility], [place] [address], [place], [place]  [address] ?  [address] [address], [place], [place]   [address] MAIN: [phone] ? TOLL FREE: [phone] ? FAX: [phone] [email or web address]    No acetaminophen/Tylenol until after 2:45pm today if needed for pain.    No ibuprofen, Advil, Aleve, Motrin, ketorolac, meloxicam, naproxen, or other NSAIDS until after 2:45pm today if needed for pain.      Post Anesthesia Home Care Instructions  Activity: Get plenty of rest for the remainder of the day. A responsible individual must stay with you for 24 hours following the procedure.  For the next 24 hours, DO NOT: -Drive a car -  Operate machinery -Drink alcoholic beverages -Take any medication unless instructed by your physician -Make any legal decisions or sign important papers.  Meals: Start with liquid foods such as gelatin or soup. Progress to regular foods as tolerated. Avoid greasy, spicy, heavy foods. If nausea and/or vomiting occur, drink only clear liquids until the nausea and/or vomiting subsides. Call your physician if vomiting continues.  Special Instructions/Symptoms: Your throat may feel dry or sore from the anesthesia or the breathing tube placed in your throat during surgery. If this causes discomfort, gargle with warm salt water. The discomfort should disappear within 24 hours.

## 2022-07-21 NOTE — Transfer of Care (Signed)
Immediate Anesthesia Transfer of Care Note  Patient: ACHILLEUS VANARSDALL  Procedure(s) Performed: Procedure(s) (LRB): LAPAROSCOPIC LEFT AND POSSIBLE RIGHT INGUINAL HERNIA (Left) TRANSABDOMINAL LEFT HYDROCELECTOMY ADULT  Patient Location: PACU  Anesthesia Type: General  Level of Consciousness: awake, oriented, sedated and patient cooperative  Airway & Oxygen Therapy: Patient Spontanous Breathing and Patient connected to face mask oxygen  Post-op Assessment: Report given to PACU RN and Post -op Vital signs reviewed and stable  Post vital signs: Reviewed and stable  Complications: No apparent anesthesia complications  Last Vitals:  Vitals Value Taken Time  BP 111/70 07/21/22 1254  Temp    Pulse 84 07/21/22 1256  Resp 20 07/21/22 1256  SpO2 100 % 07/21/22 1256  Vitals shown include unvalidated device data.  Last Pain:  Vitals:   07/21/22 0850  TempSrc: Oral  PainSc: 0-No pain      Patients Stated Pain Goal: 5 (07/21/22 0850)  Complications: No notable events documented.

## 2022-07-21 NOTE — Anesthesia Preprocedure Evaluation (Addendum)
Anesthesia Evaluation  Patient identified by MRN, date of birth, ID band Patient awake    Reviewed: Allergy & Precautions, NPO status , Patient's Chart, lab work & pertinent test results  Airway Mallampati: I  TM Distance: >3 FB Neck ROM: Full    Dental no notable dental hx.    Pulmonary neg pulmonary ROS   Pulmonary exam normal breath sounds clear to auscultation       Cardiovascular negative cardio ROS Normal cardiovascular exam Rhythm:Regular Rate:Normal     Neuro/Psych  PSYCHIATRIC DISORDERS Anxiety Depression    negative neurological ROS     GI/Hepatic negative GI ROS, Neg liver ROS,,,  Endo/Other  negative endocrine ROS    Renal/GU negative Renal ROS  negative genitourinary   Musculoskeletal negative musculoskeletal ROS (+)    Abdominal   Peds  Hematology negative hematology ROS (+)   Anesthesia Other Findings   Reproductive/Obstetrics                             Anesthesia Physical Anesthesia Plan  ASA: 2  Anesthesia Plan: General   Post-op Pain Management: Tylenol PO (pre-op)*   Induction: Intravenous  PONV Risk Score and Plan: 2 and Midazolam, Dexamethasone and Ondansetron  Airway Management Planned: Oral ETT  Additional Equipment:   Intra-op Plan:   Post-operative Plan: Extubation in OR  Informed Consent: I have reviewed the patients History and Physical, chart, labs and discussed the procedure including the risks, benefits and alternatives for the proposed anesthesia with the patient or authorized representative who has indicated his/her understanding and acceptance.     Dental advisory given  Plan Discussed with: CRNA  Anesthesia Plan Comments:        Anesthesia Quick Evaluation

## 2022-07-21 NOTE — Interval H&P Note (Signed)
History and Physical Interval Note:  07/21/2022 10:17 AM  Keith Rubio  has presented today for surgery, with the diagnosis of LEFT INGUINAL HERNIA.  The various methods of treatment have been discussed with the patient and family. After consideration of risks, benefits and other options for treatment, the patient has consented to  Procedure(s): LAPAROSCOPIC LEFT AND POSSIBLE RIGHT INGUINAL HERNIA (Left) as a surgical intervention.  The patient's history has been reviewed, patient examined, no change in status, stable for surgery.  I have reviewed the patient's chart and labs.  Questions were answered to the patient's satisfaction.    Saw Urology - holding off on hydrocele surgery until BIH repairs done   I have re-reviewed the the patient's records, history, medications, and allergies.  I have re-examined the patient.  I again discussed intraoperative plans and goals of post-operative recovery.  The patient agrees to proceed.  Keith Rubio  October 19, 1985 [id number]  Patient Care Team: Keith Bussing, MD as PCP - General (Family Medicine) Keith Soda, MD as Consulting Physician (General Surgery)  Patient Active Problem List   Diagnosis Date Noted   Left inguinal hernia 04/21/2019   Left varicocele 04/21/2019    Past Medical History:  Diagnosis Date   Anxiety    Depression     Past Surgical History:  Procedure Laterality Date   LAPAROSCOPIC APPENDECTOMY  08/14/2004   Dr Derrell Lolling   TESTICLE SURGERY  1985   Undescebded testicle    Social History   Socioeconomic History   Marital status: Single    Spouse name: Not on file   Number of children: Not on file   Years of education: Not on file   Highest education level: Not on file  Occupational History   Not on file  Tobacco Use   Smoking status: Never   Smokeless tobacco: Never  Vaping Use   Vaping Use: Every day   Substances: CBD  Substance and Sexual Activity   Alcohol use: Not Currently   Drug use: Never    Sexual activity: Not on file  Other Topics Concern   Not on file  Social History Narrative   Not on file   Social Determinants of Health   Financial Resource Strain: Not on file  Food Insecurity: Not on file  Transportation Needs: Not on file  Physical Activity: Not on file  Stress: Not on file  Social Connections: Not on file  Intimate Partner Violence: Not on file    History reviewed. No pertinent family history.  Medications Prior to Admission  Medication Sig Dispense Refill Last Dose   citalopram (CELEXA) 10 MG tablet Take 10 mg by mouth daily.   07/20/2022    Current Facility-Administered Medications  Medication Dose Route Frequency Provider Last Rate Last Admin   acetaminophen (TYLENOL) tablet 1,000 mg  1,000 mg Oral On Call to OR Keith Soda, MD       bupivacaine liposome (EXPAREL) 1.3 % injection 266 mg  20 mL Infiltration Once Keith Soda, MD       ceFAZolin (ANCEF) IVPB 2g/100 mL premix  2 g Intravenous On Call to OR Keith Soda, MD       Chlorhexidine Gluconate Cloth 2 % PADS 6 each  6 each Topical Once Keith Soda, MD       And   Chlorhexidine Gluconate Cloth 2 % PADS 6 each  6 each Topical Once Keith Rubio, [name], MD       dexamethasone (DECADRON) injection 4 mg  4 mg Intravenous On  Call to OR Keith Soda, MD       [START ON 07/22/2022] feeding supplement (ENSURE PRE-SURGERY) liquid 296 mL  296 mL Oral Once Keith Soda, MD       lactated ringers infusion   Intravenous Continuous Keith Fields, DO 10 mL/hr at 07/21/22 0858 Continued from Pre-op at 07/21/22 0858     No Known Allergies  BP 116/72   Pulse (!) 57   Temp 97.7 F (36.5 C) (Oral)   Resp 16   Ht 6\' 2"  (1.88 m)   Wt 75.3 kg   SpO2 99%   BMI 21.31 kg/m   Labs: No results found for this or any previous visit (from the past 48 hour(s)).  Imaging / Studies: No results found.   Keith Rubio, M.D., F.A.C.S. Gastrointestinal and Minimally Invasive Surgery [facility] [address] [place], [place] [address] [phone] Main / Paging  07/21/2022 10:17 AM    Keith Rubio

## 2022-07-21 NOTE — Interval H&P Note (Signed)
History and Physical Interval Note:  07/21/2022 10:08 AM  Keith Rubio  has presented today for surgery, with the diagnosis of LEFT INGUINAL HERNIA.  The various methods of treatment have been discussed with the patient and family. After consideration of risks, benefits and other options for treatment, the patient has consented to  Procedure(s): LAPAROSCOPIC LEFT AND POSSIBLE RIGHT INGUINAL HERNIA (Left) as a surgical intervention.  The patient's history has been reviewed, patient examined, no change in status, stable for surgery.  I have reviewed the patient's chart and labs.  Questions were answered to the patient's satisfaction.    I have re-reviewed the the patient's records, history, medications, and allergies.  I have re-examined the patient.  I again discussed intraoperative plans and goals of post-operative recovery.  The patient agrees to proceed.  Keith Rubio  07/07/1985 [id number]  Patient Care Team: Darrow Bussing, MD as PCP - General (Family Medicine) Karie Soda, MD as Consulting Physician (General Surgery)  Patient Active Problem List   Diagnosis Date Noted   Left inguinal hernia 04/21/2019   Left varicocele 04/21/2019    Past Medical History:  Diagnosis Date   Anxiety    Depression     Past Surgical History:  Procedure Laterality Date   LAPAROSCOPIC APPENDECTOMY  08/14/2004   Dr Derrell Lolling   TESTICLE SURGERY  1985   Undescebded testicle    Social History   Socioeconomic History   Marital status: Single    Spouse name: Not on file   Number of children: Not on file   Years of education: Not on file   Highest education level: Not on file  Occupational History   Not on file  Tobacco Use   Smoking status: Never   Smokeless tobacco: Never  Vaping Use   Vaping Use: Every day   Substances: CBD  Substance and Sexual Activity   Alcohol use: Not Currently   Drug use: Never   Sexual activity: Not on file  Other Topics Concern   Not on file   Social History Narrative   Not on file   Social Determinants of Health   Financial Resource Strain: Not on file  Food Insecurity: Not on file  Transportation Needs: Not on file  Physical Activity: Not on file  Stress: Not on file  Social Connections: Not on file  Intimate Partner Violence: Not on file    History reviewed. No pertinent family history.  Medications Prior to Admission  Medication Sig Dispense Refill Last Dose   citalopram (CELEXA) 10 MG tablet Take 10 mg by mouth daily.   07/20/2022    Current Facility-Administered Medications  Medication Dose Route Frequency Provider Last Rate Last Admin   acetaminophen (TYLENOL) tablet 1,000 mg  1,000 mg Oral On Call to OR Karie Soda, MD       bupivacaine liposome (EXPAREL) 1.3 % injection 266 mg  20 mL Infiltration Once Karie Soda, MD       ceFAZolin (ANCEF) IVPB 2g/100 mL premix  2 g Intravenous On Call to OR Karie Soda, MD       Chlorhexidine Gluconate Cloth 2 % PADS 6 each  6 each Topical Once Karie Soda, MD       And   Chlorhexidine Gluconate Cloth 2 % PADS 6 each  6 each Topical Once [name], [name], MD       dexamethasone (DECADRON) injection 4 mg  4 mg Intravenous On Call to OR Karie Soda, MD       [START ON  07/22/2022] feeding supplement (ENSURE PRE-SURGERY) liquid 296 mL  296 mL Oral Once Karie Soda, MD       lactated ringers infusion   Intravenous Continuous Lannie Fields, DO 10 mL/hr at 07/21/22 1610 Continued from Pre-op at 07/21/22 0858     No Known Allergies  BP 116/72   Pulse (!) 57   Temp 97.7 F (36.5 C) (Oral)   Resp 16   Ht 6\' 2"  (1.88 m)   Wt 75.3 kg   SpO2 99%   BMI 21.31 kg/m   Labs: No results found for this or any previous visit (from the past 48 hour(s)).  Imaging / Studies: No results found.   [name], M.D., F.A.C.S. Gastrointestinal and Minimally Invasive Surgery [facility] [address] [place], [place]  [address] [phone] Main / Paging  07/21/2022 10:08 AM    [name]

## 2022-07-21 NOTE — H&P (Signed)
07/21/2022    REFERRING PHYSICIAN: Self  Patient Care Team: Keith Bussing, MD as PCP - General (Family Medicine)  PROVIDER: Jarrett Soho, MD  [facility] MRN: [id number] DOB: 17-Mar-1985  SUBJECTIVE  Chief Complaint: Hernia   Keith Rubio is a 37 y.o. male who is seen today as an office consultation at the request of . Self for evaluation of persistent left inguinal hernia.  History of Present Illness:  36 year old male. Underwent appendectomy by Dr. Derrell Lolling in 2006 with a group who is since retired. Noted some left groin pain in 2017. No definite hernia. Ultrasound confirmed left hydrocele and small varicocele. Epididymal cysts. No testicular masses or obvious hernia.  Began to have worsening pain and bulging. I saw in 2021 and documented a left inguinal hernia. Possible early right inguinal hernia. I recommended repair. He did not schedule. Apparently he is having worsening symptoms and wishes to reconsider. 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 He is rather physically active and does not 20 mile hikes. He does not notes that the hernia has not gotten particularly larger but is occasionally sensitive. Still has a hydrocele on the left side as well. Wondering if that can be removed as well. Does not smoke tobacco. No diabetes. No cardiac or pulmonary issues. Moves his bowels every day. No nocturia  Medical History:  Past Medical History: Diagnosis Date Anxiety Arthritis  Patient Active Problem List Diagnosis Left inguinal hernia  Past Surgical History: Procedure Laterality Date APPENDECTOMY   No Known Allergies  Current Outpatient Medications on File Prior to Visit Medication Sig Dispense Refill citalopram (CELEXA) 10 MG tablet Take 10 mg by mouth once daily sildenafiL (VIAGRA) 25 MG tablet Take 25 mg by mouth once daily as needed for Erectile Dysfunction  No current facility-administered medications on file  prior to visit.  Family History Problem Relation Age of Onset Stroke Father Obesity Father High blood pressure (Hypertension) Father Coronary Artery Disease (Blocked arteries around heart) Father   Social History  Tobacco Use Smoking Status Never Smokeless Tobacco Never   Social History  Socioeconomic History Marital status: Unknown Tobacco Use Smoking status: Never Smokeless tobacco: Never Substance and Sexual Activity Alcohol use: Not Currently Drug use: Not Currently  ############################################################  Review of Systems: A complete review of systems (ROS) was obtained from the patient. We have reviewed this information and discussed as appropriate with the patient. See HPI as well for other pertinent ROS.  Constitutional: No fevers, chills, sweats. Weight stable Eyes: No vision changes, No discharge HENT: No sore throats, nasal drainage Lymph: No neck swelling, No bruising easily Pulmonary: No cough, productive sputum CV: No orthopnea, PND . No exertional chest/neck/shoulder/arm pain. Patient can walk >5 miles without difficulty.  GI: No personal nor family history of GI/colon cancer, inflammatory bowel disease, irritable bowel syndrome, allergy such as Celiac Sprue, dietary/dairy problems, colitis, ulcers nor gastritis. No recent sick contacts/gastroenteritis. No travel outside the country. No changes in diet.  Renal: No UTIs, No hematuria Genital: No drainage, bleeding, masses Musculoskeletal: No severe joint pain. Good ROM major joints Skin: No sores or lesions Heme/Lymph: No easy bleeding. No swollen lymph nodes Neuro: No active seizures. No facial droop Psych: No hallucinations. No agitation  OBJECTIVE  Vitals: 05/01/22 0840 BP: 110/70 Pulse: 89 Weight: 74.9 kg (165 lb 3.2 oz) Height: 188 cm (6\' 2" )  Body mass index is 21.21 kg/m.  PHYSICAL EXAM:  Constitutional: Not cachectic. Hygeine adequate. Vitals signs as  above. Eyes:  No glasses. Vision adequate,Pupils reactive, normal extraocular movements. Sclera nonicteric Neuro: CN II-XII intact. No major focal sensory defects. No major motor deficits. Lymph: No head/neck/groin lymphadenopathy Psych: No severe agitation. No severe anxiety. Judgment & insight Adequate, Oriented x4, HENT: Normocephalic, Mucus membranes moist. No thrush. Hearing: adequate Neck: Supple, No tracheal deviation. No obvious thyromegaly Chest: No pain to chest wall compression. Good respiratory excursion. No audible wheezing CV: Pulses intact. regular. No major extremity edema Ext: No obvious deformity or contracture. Edema: Not present. No cyanosis Skin: No major subcutaneous nodules. Warm and dry Musculoskeletal: Severe joint rigidity not present. No obvious clubbing. No digital petechiae. Mobility: no assist device moving easily without restrictions  Abdomen: Flat Soft. Nondistended. Nontender. Hernia: Not present. Diastasis recti: Not present. No hepatomegaly. No splenomegaly.  Genital/Pelvic: Inguinal hernia: Obvious left inguinal hernia and groin reducible. Small impulse on the right side suspicious for completo right indirect inguinal hernia as well. Does have some fullness in the left scrotum suspicious for hydrocele. No testicular masses. Epididymides underwhelming.. Inguinal lymph nodes: without lymphadenopathy nor hidradenitis.  Rectal: (Deferred)    ###################################################################  Labs, Imaging and Diagnostic Testing:  Located in 'Care Everywhere' section of Epic EMR chart  PRIOR [facility] CLINIC NOTES:  Located in 'Care Everywhere' section of Epic EMR chart  Keith Rubio Documented: 04/21/2019 3:57 PM  DOB: [birth year]  History of Present Illness Keith Sportsman MD; 04/21/2019 4:51 PM) The patient is a 37 year old male who presents with an inguinal hernia. Note for "Inguinal hernia": ` ` ` Patient sent for surgical  consultation at the request of Dr Docia Chuck  Chief Complaint: Left groin swelling. Probable hernia. ` ` The patient is a pleasant young active thin male. His had intermittent left groin discomfort and swelling. Saw Dr. Derrell Lolling in 2017. Some concerns for groin pain and possible hernia but not definite. CT scan offered before deciding on surgery. Patient felt better and held off. He notes that the pain persisted. Had more episodes of bulging. More obvious now. He has more constant but small bulge. Had episode of worsening swelling and discomfort last month that concerned him. Wished to be reevaluated. Dr. Derrell Lolling has since retired, separation comes in to see me. 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 An emergent appendectomy by Dr. Derrell Lolling in 2006. No other surgeries. He does not smoke tobacco. He occasionally has smokes marijuana for some anxiety and other issues. Not a diabetic. Walks at least a half hour without difficulty. No cardiac or pulmonary issues. No problems with urination. Moses bowels every day. No history of skin infections.  (Review of systems as stated in this history (HPI) or in the review of systems. Otherwise all other 12 point ROS are negative) ` ` `  This patient encounter took 35 minutes today to perform the following: obtain history, perform exam, review outside records, interpret tests & imaging, counsel the patient on their diagnosis; and, document this encounter, including findings & plan in the electronic health record (EHR).  Past Surgical History Keith Sportsman, MD; 04/21/2019 4:43 PM) Appendectomy [2008]: Foot Surgery Left. EXPLORATION, TESTICLE, UNDESCENDED, INGUINAL APPROACH [id number]) [1985]:  Diagnostic Studies History Renee Ramus, CMA; 04/21/2019 3:57 PM) Colonoscopy never  Medication History (Armen Emelda Fear, CMA; 04/21/2019 3:58 PM) No Current Medications Medications Reconciled  Social History  Renee Ramus, CMA; 04/21/2019 3:57 PM) Alcohol use Occasional alcohol use. Caffeine use Carbonated beverages, Coffee. Illicit drug use Prefer to discuss  with provider. Tobacco use Never smoker.  Family History Renee Ramus, CMA; 04/21/2019 3:57 PM) First Degree Relatives No pertinent family history  Other Problems Renee Ramus, CMA; 04/21/2019 3:57 PM) Anxiety Disorder Back Pain Depression Hemorrhoids Inguinal Hernia  Review of Systems (Armen Ferguson CMA; 04/21/2019 3:57 PM) General Present- Night Sweats. Not Present- Appetite Loss, Chills, Fatigue, Fever, Weight Gain and Weight Loss. Skin Present- Dryness. Not Present- Change in Wart/Mole, Hives, Jaundice, New Lesions, Non-Healing Wounds, Rash and Ulcer. HEENT Present- Seasonal Allergies. Not Present- Earache, Hearing Loss, Hoarseness, Nose Bleed, Oral Ulcers, Ringing in the Ears, Sinus Pain, Sore Throat, Visual Disturbances, Wears glasses/contact lenses and Yellow Eyes. Respiratory Not Present- Bloody sputum, Chronic Cough, Difficulty Breathing, Snoring and Wheezing. Breast Not Present- Breast Mass, Breast Pain, Nipple Discharge and Skin Changes. Cardiovascular Not Present- Chest Pain, Difficulty Breathing Lying Down, Leg Cramps, Palpitations, Rapid Heart Rate, Shortness of Breath and Swelling of Extremities. Gastrointestinal Present- Hemorrhoids and Indigestion. Not Present- Abdominal Pain, Bloating, Bloody Stool, Change in Bowel Habits, Chronic diarrhea, Constipation, Difficulty Swallowing, Excessive gas, Gets full quickly at meals, Nausea, Rectal Pain and Vomiting. Male Genitourinary Not Present- Blood in Urine, Change in Urinary Stream, Frequency, Impotence, Nocturia, Painful Urination, Urgency and Urine Leakage. Musculoskeletal Present- Back Pain and Joint Stiffness. Not Present- Joint Pain, Muscle Pain, Muscle Weakness and Swelling of Extremities. Neurological Present- Headaches and Tingling. Not Present- Decreased Memory,  Fainting, Numbness, Seizures, Tremor, Trouble walking and Weakness. Psychiatric Present- Anxiety and Depression. Not Present- Bipolar, Change in Sleep Pattern, Fearful and Frequent crying. Endocrine Not Present- Cold Intolerance, Excessive Hunger, Hair Changes, Heat Intolerance, Hot flashes and New Diabetes. Hematology Not Present- Blood Thinners, Easy Bruising, Excessive bleeding, Gland problems, HIV and Persistent Infections.  Vitals (Armen Ferguson CMA; 04/21/2019 3:58 PM) 04/21/2019 3:57 PM Weight: 163.38 lb Height: 74 in Body Surface Area: 1.99 m Body Mass Index: 20.98 kg/m Temp.: 98.6 F Pulse: 78 (Regular) P.OX: 98% (Room air) BP: 102/72 (Sitting, Left Arm, Standard)  Physical Exam Keith Sportsman MD; 04/21/2019 4:48 PM)  General Mental Status - Alert. General Appearance - Not in acute distress, Not Sickly. Orientation - Oriented X3. Hydration - Well hydrated. Voice - Normal.  Integumentary Global Assessment Upon inspection and palpation of skin surfaces of the - Axillae: non-tender, no inflammation or ulceration, no drainage. and Distribution of scalp and body hair is normal. General Characteristics Temperature - normal warmth is noted.  Head and Neck Head - normocephalic, atraumatic with no lesions or palpable masses. Face Global Assessment - atraumatic, no absence of expression. Neck Global Assessment - no abnormal movements, no bruit auscultated on the right, no bruit auscultated on the left, no decreased range of motion, non-tender. Trachea - midline. Thyroid Gland Characteristics - non-tender.  Eye Eyeball - Left - Extraocular movements intact, No Nystagmus - Left. Eyeball - Right - Extraocular movements intact, No Nystagmus - Right. Cornea - Left - No Hazy - Left. Cornea - Right - No Hazy - Right. Sclera/Conjunctiva - Left - No scleral icterus, No Discharge - Left. Sclera/Conjunctiva - Right - No scleral icterus, No Discharge - Right. Pupil - Left - Direct  reaction to light normal. Pupil - Right - Direct reaction to light normal.  ENMT Ears Pinna - Left - no drainage observed, no generalized tenderness observed. Pinna - Right - no drainage observed, no generalized tenderness observed. Nose and Sinuses External Inspection of the Nose - no destructive lesion observed. Inspection of the nares - Left - quiet respiration. Inspection of the  nares - Right - quiet respiration. Mouth and Throat Lips - Upper Lip - no fissures observed, no pallor noted. Lower Lip - no fissures observed, no pallor noted. Nasopharynx - no discharge present. Oral Cavity/Oropharynx - Tongue - no dryness observed. Oral Mucosa - no cyanosis observed. Hypopharynx - no evidence of airway distress observed.  Chest and Lung Exam Inspection Movements - Normal and Symmetrical. Accessory muscles - No use of accessory muscles in breathing. Palpation Palpation of the chest reveals - Non-tender. Auscultation Breath sounds - Normal and Clear.  Cardiovascular Auscultation Rhythm - Regular. Murmurs & Other Heart Sounds - Auscultation of the heart reveals - No Murmurs and No Systolic Clicks.  Abdomen Inspection Inspection of the abdomen reveals - No Visible peristalsis and No Abnormal pulsations. Umbilicus - No Bleeding, No Urine drainage. Palpation/Percussion Palpation and Percussion of the abdomen reveal - Soft, Non Tender, No Rebound tenderness, No Rigidity (guarding) and No Cutaneous hyperesthesia. Note: Abdomen soft. Nontender. Not distended. No umbilical or incisional hernias. No guarding.  Male Genitourinary Sexual Maturity Tanner 5 - Adult hair pattern and Adult penile size and shape. Note: Left groin bulging consistent with reducible inguinal hernia. Moderate varicocele.  On the right side some sensitivity on/Valsalva. Possible early right inguinal hernia. No varicocele on that side.  Peripheral Vascular Upper Extremity Inspection - Left - No Cyanotic nailbeds - Left,  Not Ischemic. Inspection - Right - No Cyanotic nailbeds - Right, Not Ischemic.  Neurologic Neurologic evaluation reveals - normal attention span and ability to concentrate, able to name objects and repeat phrases. Appropriate fund of knowledge , normal sensation and normal coordination. Mental Status Affect - not angry, not paranoid. Cranial Nerves - Normal Bilaterally. Gait - Normal.  Neuropsychiatric Mental status exam performed with findings of - able to articulate well with normal speech/language, rate, volume and coherence, thought content normal with ability to perform basic computations and apply abstract reasoning and no evidence of hallucinations, delusions, obsessions or homicidal/suicidal ideation.  Musculoskeletal Global Assessment Spine, Ribs and Pelvis - no instability, subluxation or laxity. Right Upper Extremity - no instability, subluxation or laxity.  Lymphatic Head & Neck  General Head & Neck Lymphatics: Bilateral - Description - No Localized lymphadenopathy. Axillary  General Axillary Region: Bilateral - Description - No Localized lymphadenopathy. Femoral & Inguinal  Generalized Femoral & Inguinal Lymphatics: Left - Description - No Localized lymphadenopathy. Right - Description - No Localized lymphadenopathy.  Assessment & Plan Keith Sportsman MD; 04/21/2019 4:42 PM)  LEFT INGUINAL HERNIA (K40.90) Impression: Persistent intermittent groin pain now with swelling and obvious left hernia. Some sensitivity on the right as well  I think he would benefit from hernia repair. I would do laparoscopic approach to make sure there is no hernia on that side other. Marland Kitchen  He did have concerns about mesh, reading stories online. I tried to reassure him. Recalls having some agitation in the recovery room after his emergent appendectomy in 2008. Hopefully they can minimize postoperative issues. Good candidate for ERAS pathway  VARICOCELE (I86.1) Impression: Moderate but relatively  stable varicocele compared to ultrasound 2017.  PREOP - ING HERNIA - ENCOUNTER FOR PREOPERATIVE EXAMINATION FOR GENERAL SURGICAL PROCEDURE (Z01.818)  Current Plans You are being scheduled for surgery - Our schedulers will call you.  You should hear from our office's scheduling department within 5 working days about the location, date, and time of surgery. We try to make accommodations for patient's preferences in scheduling surgery, but sometimes the OR schedule or the surgeon's schedule  prevents [place] from making those accommodations.  If you have not heard from our office [phone]) in 5 working days, call the office and ask for your surgeon's nurse.  If you have other questions about your diagnosis, plan, or surgery, call the office and ask for your surgeon's nurse.  Written instructions provided The anatomy & physiology of the abdominal wall and pelvic floor was discussed. The pathophysiology of hernias in the inguinal and pelvic region was discussed. Natural history risks such as progressive enlargement, pain, incarceration, and strangulation was discussed. Contributors to complications such as smoking, obesity, diabetes, prior surgery, etc were discussed.  I feel the risks of no intervention will lead to serious problems that outweigh the operative risks; therefore, I recommended surgery to reduce and repair the hernia. I explained laparoscopic techniques with possible need for an open approach. I noted usual use of mesh to patch and/or buttress hernia repair  Risks such as bleeding, infection, abscess, need for further treatment, heart attack, death, and other risks were discussed. I noted a good likelihood this will help address the problem. Goals of post-operative recovery were discussed as well. Possibility that this will not correct all symptoms was explained. I stressed the importance of low-impact activity, aggressive pain control, avoiding constipation, & not pushing through pain to  minimize risk of post-operative chronic pain or injury. Possibility of reherniation was discussed. We will work to minimize complications.  An educational handout further explaining the pathology & treatment options was given as well. Questions were answered. The patient expresses understanding & wishes to proceed with surgery.  Pt Education - Pamphlet Given - Laparoscopic Hernia Repair: discussed with patient and provided information. Pt Education - [facility] Pain Control (Dusan Lipford) Pt Education - [facility] Hernia Post-Op HCI (Nkenge Sonntag): discussed with patient and provided information. Pt Education - [facility] Mesh education: discussed with patient and provided information.  Keith Sportsman, MD, FACS, MASCRS Gastrointestinal and Minimally Invasive Surgery  [facility] [address], [address] [place], [place] [address] [phone] Main / Paging [phone] Fax   Electronically signed by Karie Soda, MD at 04/21/2019 4:52 PM EST  SURGERY NOTES:  Located in 'Care Everywhere' section of Epic EMR chart  PATHOLOGY:  Located in 'Care Everywhere' section of Epic EMR chart  Assessment and Plan: DIAGNOSES:  Diagnoses and all orders for this visit:  Left inguinal hernia  Hydrocele, left    ASSESSMENT/PLAN  Pleasant and active male with obvious left inguinal hernia with possible small contralateral tight inguinal hernia on Valsalva only. Known hydrocele.  He is a reasonable candidate for a laparoscopic preperitoneal approach with mesh. He is interested in proceeding now compared to 2021.   The anatomy & physiology of the abdominal wall and pelvic floor was discussed. The pathophysiology of hernias in the inguinal and pelvic region was discussed. Natural history risks such as progressive enlargement, pain, incarceration, and strangulation was discussed. Contributors to complications such as smoking, obesity, diabetes, prior surgery, etc were discussed.  I feel the risks of no  intervention will lead to serious problems that outweigh the operative risks; therefore, I recommended surgery to reduce and repair the hernia. I explained laparoscopic techniques with possible need for an open approach. I noted usual use of mesh to patch and/or buttress hernia repair  Risks such as bleeding, infection, abscess, need for further treatment, injury to other organs, need for repair of tissues / organs, stroke, heart attack, death, and other risks were discussed. I noted a good likelihood this  will help address the problem. Goals of post-operative recovery were discussed as well. Possibility that this will not correct all symptoms was explained. I stressed the importance of low-impact activity, aggressive pain control, avoiding constipation, & not pushing through pain to minimize risk of post-operative chronic pain or injury. Possibility of reherniation was discussed. We will work to minimize complications.  An educational handout further explaining the pathology & treatment options was given as well. Questions were answered. The patient expresses understanding & wishes to proceed with surgery.     Keith Sportsman, MD, FACS, MASCRS Esophageal, Gastrointestinal & Colorectal Surgery Robotic and Minimally Invasive Surgery  [facility] [address] [place], [place] [address] [phone] Fax [phone] Main  CONTACT INFORMATION:  Weekday (9AM-5PM): Call [facility] main office at [phone]  Weeknight (5PM-9AM) or Weekend/Holiday: Check [email or web address] (password " [email or web address]") for General Surgery [facility] coverage  (Please, do not use SecureChat as it is not reliable communication to reach operating surgeons for immediate patient care given surgeries/outpatient duties/clinic/cross-coverage/off post-call which would lead to a delay in care.  Epic staff messaging available for outptient concerns, but may not be answered for 48 hours  or more).    07/21/2022

## 2022-07-21 NOTE — Op Note (Signed)
07/21/2022  12:45 PM  PATIENT:  Keith Rubio  37 y.o. male  Patient Care Team: Darrow Bussing, MD as PCP - General (Family Medicine) Karie Soda, MD as Consulting Physician (General Surgery)  PRE-OPERATIVE DIAGNOSIS:  LEFT INGUINAL HERNIA  POST-OPERATIVE DIAGNOSIS:   BILATERAL INGUINAL HERNIA LEFT HYDROCELE  PROCEDURE:   LAPAROSCOPIC BILATERAL INGUINAL HERNIA REPAIRS TRANSABDOMINAL LEFT HYDROCELECTOMY - SUBTOTAL TRANSVERSUS ABDOMINIS PLANE (TAP) BLOCK - BILATERAL  SURGEON:  [name], MD  ASSISTANT:  (n/a)   ANESTHESIA:  General endotracheal intubation anesthesia (GETA) and Regional TRANSVERSUS ABDOMINIS PLANE (TAP) nerve block -BILATERAL for perioperative & postoperative pain control at the level of the transverse abdominis & preperitoneal spaces along the flank at the anterior axillary line, from subcostal ridge to iliac crest under laparoscopic guidance provided with liposomal bupivacaine (Experel) 20mL mixed with 50 mL of bupivicaine 0.25% with epinephrine  Estimated Blood Loss (EBL):   Total I/O In: 400 [I.V.:300; IV Piggyback:100] Out: - .   (See anesthesia record)  Delay start of Pharmacological VTE agent (>24hrs) due to concerns of significant anemia, surgical blood loss, or risk of bleeding?:  no  DRAINS: (None)  SPECIMEN:  Left Inguinal hernia sac with hydrocele wall  DISPOSITION OF SPECIMEN:  Pathology  COUNTS:  Sponge, needle, & instrument counts CORRECT  PLAN OF CARE: Discharge to home after PACU  PATIENT DISPOSITION:  PACU - hemodynamically stable.  INDICATION: Pleasant active male with left groin and scrotal swelling.  Found to have evidence of inguinal hernia as well as a hydrocele.  Possible small contralateral right inguinal hernia as well.  Urology consultation made.  Given the fact that he had more symptoms related to his hernia with discomfort recommendation made to start with inguinal hernia first.  Offered exploration and repair of  hernias found.  Possible decompression of hydrocele possible.  The anatomy & physiology of the abdominal wall and pelvic floor was discussed.  The pathophysiology of hernias in the inguinal and pelvic region was discussed.  Natural history risks such as progressive enlargement, pain, incarceration & strangulation was discussed.   Contributors to complications such as smoking, obesity, diabetes, prior surgery, etc were discussed.    I feel the risks of no intervention will lead to serious problems that outweigh the operative risks; therefore, I recommended surgery to reduce and repair the hernia.  I explained laparoscopic techniques with possible need for an open approach.  I noted usual use of mesh to patch and/or buttress hernia repair  Risks such as bleeding, infection, abscess, need for further treatment, heart attack, death, and other risks were discussed.  I noted a good likelihood this will help address the problem.   Goals of post-operative recovery were discussed as well.  Possibility that this will not correct all symptoms was explained.  I stressed the importance of low-impact activity, aggressive pain control, avoiding constipation, & not pushing through pain to minimize risk of post-operative chronic pain or injury. Possibility of reherniation was discussed.  We will work to minimize complications.     An educational handout further explaining the pathology & treatment options was given as well.  Questions were answered.  The patient expresses understanding & wishes to proceed with surgery.  OR FINDINGS: Moderate indirect inguinal hernia on left side.  More subtle indirect hernia on right side.  No direct space, femoral, obturator hernias.  Contiguous with the left inguinal hernia sac was an obvious hydrocele that was able to decompress.  Able to excise the enterocele wall off  of the spermatic cord and vas deferens.  Able to get down to the proximal epididymis and  removed.  DESCRIPTION:  The patient was identified & brought into the operating room. The patient was positioned supine with arms tucked. SCDs were active during the entire case. The patient underwent general anesthesia without any difficulty.  The abdomen was prepped and draped in a sterile fashion. The patient's bladder was emptied.  A Surgical Timeout confirmed our plan.  I made a transverse incision through the inferior umbilical fold.  I made a small transverse nick through the anterior rectus fascia contralateral to the inguinal hernia side and placed a 0-vicryl stitch through the fascia.  I placed a Hasson trocar into the preperitoneal plane.  Entry was clean.  We induced carbon dioxide insufflation. Camera inspection revealed no injury.  I used a 10mm angled scope to bluntly free the peritoneum off the infraumbilical anterior abdominal wall.  I created enough of a preperitoneal pocket to place 5mm ports into the right & left mid-abdomen into this preperitoneal cavity.  I focused attention on the LEFT pelvis since that was the dominant hernia side.   I used blunt & focused sharp dissection to free the peritoneum off the flank and down to the pubic rim.  I freed the anteriolateral bladder wall off the anteriolateral pelvic wall, sparing midline attachments.   I located a swath of peritoneum going into a hernia fascial defect at the  internal ring consistent with  an indirect inguinal hernia..  I gradually freed the peritoneal hernia sac off safely and reduced it into the preperitoneal space.  I freed the peritoneum off the spermatic vessels & vas deferens.  I freed peritoneum off the retroperitoneum along the psoas muscle.  I was able to return the hernia sac along the spermatic cord and reduce it from the inguinal canal.  Became contiguous with a cystic structure on open and decompressed serous fluid consistent with his hydrocele.  I continued to help reduce the hydrocele off the spermatic cord  vessels until I came to the proximal end and did miss.  Side most of the hydrocele wall with cautery scissors.  Hemostasis was good.  Small inguinal canal cord lipoma was dissected away & removed.  I checked & assured hemostasis.  Hernia sac and hydrocele wall sent.  Peritoneal defect from excision of the hernia sac and hydrocele was done with 2-0 Vicryl suture using laparoscopic intracorporeal suturing to good result.  I turned attention on the opposite  RIGHT pelvis.  I did dissection in a similar, mirror-image fashion. The patient had a subtle but definite indirect inguinal hernia on the right side as well.Marland Kitchen  He did have some inflammation and thickening of his distal end of his hernia sac that if free down.  Did peritoneal repair with 2-0 Vicryl intracorporeal laparoscopic suturing..   I checked & assured hemostasis.     I chose sheets of medium-weight polypropylene Bard Marlex 15x15cm, one for the left side since it was a moderate hernia.  The right side was smaller so I used a lightweight soft polypropylene Bard mesh 15 x 15 cm.   I cut a single sigmoid-shaped slit ~6cm from a corner of each mesh.  I placed the meshes into the preperitoneal space & laid them as overlapping diamonds such that at the inferior points, a 6x6 cm corner flap rested in the true anterolateral pelvis, covering the obturator & femoral foramina.   I allowed the bladder to return to the pubis,  this helping tuck the corners of the mesh in the anteriolateral pelvis.  The medial corners overlapped each other across midline cephalad to the pubic rim.   Given the numerous hernias of moderate size, I placed a third 15x15cm ultralightweight Bard Soft mesh in the center as a vertical diamond.  The lateral wings of the mesh overlap across the direct spaces and internal rings where the dominant hernias were.  This provided good coverage and reinforcement of the hernia repairs.  Because of the central mesh placement with good overlap, I did not  place any tacks.   I held the hernia sacs cephalad & evacuated carbon dioxide.  I closed the fascia with absorbable suture.  I closed the skin using 4-0 monocryl stitch.  Sterile dressings were applied.   The patient was extubated & arrived in the PACU in stable condition..  I had discussed postoperative care with the patient and his significant other, [employer], in the holding area.  Instructions are written in the chart.  I made an attempt to locate & reach the desired patient contact to discuss the patient's overall status and my recommendations.  No one is available at this time.  OR team and receptionist were updated.  My plans & instructions have been written in the chart.  Prescription sent to desired pharmacy.   [name], M.D., F.A.C.S. Gastrointestinal and Minimally Invasive Surgery [facility] [address], [address] [place], [place] [address] [phone] Main / Paging  07/21/2022 12:45 PM

## 2022-07-24 LAB — SURGICAL PATHOLOGY

## 2022-07-26 ENCOUNTER — Encounter (HOSPITAL_BASED_OUTPATIENT_CLINIC_OR_DEPARTMENT_OTHER): Payer: Self-pay | Admitting: Surgery
